# Patient Record
Sex: Female | Born: 1935 | Smoking: Former smoker
Health system: Southern US, Community
[De-identification: ages and names within clinical notes are randomized; demographics above are authoritative.]

## PROBLEM LIST (undated history)

## (undated) DIAGNOSIS — E7800 Pure hypercholesterolemia, unspecified: Secondary | ICD-10-CM

## (undated) DIAGNOSIS — H9190 Unspecified hearing loss, unspecified ear: Secondary | ICD-10-CM

## (undated) DIAGNOSIS — H4900 Third [oculomotor] nerve palsy, unspecified eye: Secondary | ICD-10-CM

## (undated) DIAGNOSIS — I517 Cardiomegaly: Secondary | ICD-10-CM

## (undated) DIAGNOSIS — M170 Bilateral primary osteoarthritis of knee: Secondary | ICD-10-CM

## (undated) DIAGNOSIS — E119 Type 2 diabetes mellitus without complications: Secondary | ICD-10-CM

## (undated) DIAGNOSIS — K8020 Calculus of gallbladder without cholecystitis without obstruction: Secondary | ICD-10-CM

## (undated) DIAGNOSIS — E042 Nontoxic multinodular goiter: Secondary | ICD-10-CM

## (undated) DIAGNOSIS — I2510 Atherosclerotic heart disease of native coronary artery without angina pectoris: Secondary | ICD-10-CM

## (undated) DIAGNOSIS — D219 Benign neoplasm of connective and other soft tissue, unspecified: Secondary | ICD-10-CM

## (undated) DIAGNOSIS — I729 Aneurysm of unspecified site: Secondary | ICD-10-CM

## (undated) DIAGNOSIS — I728 Aneurysm of other specified arteries: Secondary | ICD-10-CM

## (undated) DIAGNOSIS — K2970 Gastritis, unspecified, without bleeding: Secondary | ICD-10-CM

## (undated) DIAGNOSIS — E785 Hyperlipidemia, unspecified: Secondary | ICD-10-CM

## (undated) DIAGNOSIS — R32 Unspecified urinary incontinence: Secondary | ICD-10-CM

## (undated) DIAGNOSIS — L03031 Cellulitis of right toe: Secondary | ICD-10-CM

## (undated) DIAGNOSIS — G4700 Insomnia, unspecified: Secondary | ICD-10-CM

## (undated) DIAGNOSIS — I7090 Unspecified atherosclerosis: Secondary | ICD-10-CM

## (undated) DIAGNOSIS — J42 Unspecified chronic bronchitis: Secondary | ICD-10-CM

## (undated) DIAGNOSIS — M1990 Unspecified osteoarthritis, unspecified site: Secondary | ICD-10-CM

## (undated) HISTORY — DX: Insomnia, unspecified: G47.00

## (undated) HISTORY — DX: Calculus of gallbladder without cholecystitis without obstruction: K80.20

## (undated) HISTORY — DX: Gastritis, unspecified, without bleeding: K29.70

## (undated) HISTORY — DX: Cellulitis of right toe: L03.031

## (undated) HISTORY — DX: Benign neoplasm of connective and other soft tissue, unspecified: D21.9

## (undated) HISTORY — DX: Aneurysm of unspecified site: I72.9

## (undated) HISTORY — DX: Unspecified urinary incontinence: R32

## (undated) HISTORY — DX: Aneurysm of other specified arteries: I72.8

## (undated) HISTORY — DX: Unspecified osteoarthritis, unspecified site: M19.90

## (undated) HISTORY — DX: Nontoxic multinodular goiter: E04.2

## (undated) HISTORY — PX: CHOLECYSTECTOMY: SHX55

## (undated) HISTORY — DX: Atherosclerotic heart disease of native coronary artery without angina pectoris: I25.10

## (undated) HISTORY — DX: Bilateral primary osteoarthritis of knee: M17.0

## (undated) HISTORY — DX: Type 2 diabetes mellitus without complications: E11.9

## (undated) HISTORY — DX: Cardiomegaly: I51.7

## (undated) HISTORY — DX: Hyperlipidemia, unspecified: E78.5

## (undated) HISTORY — DX: Unspecified chronic bronchitis: J42

## (undated) HISTORY — DX: Third (oculomotor) nerve palsy, unspecified eye: H49.00

## (undated) HISTORY — DX: Pure hypercholesterolemia, unspecified: E78.00

## (undated) HISTORY — DX: Unspecified atherosclerosis: I70.90

## (undated) HISTORY — DX: Unspecified hearing loss, unspecified ear: H91.90

---

## 2005-09-12 HISTORY — PX: CRANIOTOMY: SHX93

## 2005-12-07 DIAGNOSIS — J4489 Other specified chronic obstructive pulmonary disease: Secondary | ICD-10-CM | POA: Insufficient documentation

## 2005-12-07 DIAGNOSIS — I671 Cerebral aneurysm, nonruptured: Secondary | ICD-10-CM | POA: Insufficient documentation

## 2005-12-07 DIAGNOSIS — I609 Nontraumatic subarachnoid hemorrhage, unspecified: Secondary | ICD-10-CM | POA: Insufficient documentation

## 2006-11-23 HISTORY — PX: JOINT REPLACEMENT: SHX530

## 2011-06-01 DIAGNOSIS — I2510 Atherosclerotic heart disease of native coronary artery without angina pectoris: Secondary | ICD-10-CM

## 2011-06-01 HISTORY — DX: Atherosclerotic heart disease of native coronary artery without angina pectoris: I25.10

## 2012-05-30 HISTORY — PX: CATARACT EXTRACTION: SUR2

## 2014-10-09 DIAGNOSIS — IMO0002 Reserved for concepts with insufficient information to code with codable children: Secondary | ICD-10-CM | POA: Insufficient documentation

## 2014-10-09 DIAGNOSIS — H02401 Unspecified ptosis of right eyelid: Secondary | ICD-10-CM | POA: Insufficient documentation

## 2014-10-09 DIAGNOSIS — I729 Aneurysm of unspecified site: Secondary | ICD-10-CM | POA: Insufficient documentation

## 2016-06-19 DIAGNOSIS — E042 Nontoxic multinodular goiter: Secondary | ICD-10-CM

## 2016-06-19 HISTORY — DX: Nontoxic multinodular goiter: E04.2

## 2016-07-13 DIAGNOSIS — D219 Benign neoplasm of connective and other soft tissue, unspecified: Secondary | ICD-10-CM

## 2016-07-13 HISTORY — DX: Benign neoplasm of connective and other soft tissue, unspecified: D21.9

## 2016-07-14 DIAGNOSIS — G4700 Insomnia, unspecified: Secondary | ICD-10-CM

## 2016-07-14 HISTORY — DX: Insomnia, unspecified: G47.00

## 2016-07-29 HISTORY — PX: CHOLECYSTECTOMY, LAPAROSCOPIC: SHX56

## 2016-08-16 DIAGNOSIS — K2970 Gastritis, unspecified, without bleeding: Secondary | ICD-10-CM

## 2016-08-16 HISTORY — DX: Gastritis, unspecified, without bleeding: K29.70

## 2016-08-17 DIAGNOSIS — R109 Unspecified abdominal pain: Secondary | ICD-10-CM | POA: Insufficient documentation

## 2017-04-25 ENCOUNTER — Other Ambulatory Visit: Payer: Self-pay

## 2017-04-25 DIAGNOSIS — I728 Aneurysm of other specified arteries: Secondary | ICD-10-CM

## 2017-04-26 ENCOUNTER — Encounter: Payer: Self-pay | Admitting: Surgery

## 2017-05-18 DIAGNOSIS — K219 Gastro-esophageal reflux disease without esophagitis: Secondary | ICD-10-CM | POA: Insufficient documentation

## 2017-05-18 DIAGNOSIS — G8921 Chronic pain due to trauma: Secondary | ICD-10-CM | POA: Insufficient documentation

## 2017-06-26 ENCOUNTER — Ambulatory Visit (INDEPENDENT_AMBULATORY_CARE_PROVIDER_SITE_OTHER): Payer: Medicare Other | Admitting: Surgery

## 2017-06-26 ENCOUNTER — Ambulatory Visit (HOSPITAL_COMMUNITY)
Admission: RE | Admit: 2017-06-26 | Discharge: 2017-06-26 | Disposition: A | Discharge status: Home / Self Care | Payer: Medicare Other | Source: Ambulatory Visit | Attending: Surgery | Admitting: Surgery

## 2017-06-26 ENCOUNTER — Encounter: Payer: Self-pay | Admitting: Surgery

## 2017-06-26 VITALS — BP 177/83 | HR 51 | Temp 97.8°F | Resp 20 | Ht 65.0 in | Wt 176.0 lb

## 2017-06-26 DIAGNOSIS — I728 Aneurysm of other specified arteries: Secondary | ICD-10-CM | POA: Diagnosis present
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
## 2017-06-27 NOTE — Addendum Note (Signed)
Addended by: Lianne Cure A on: 06/27/2017 04:05 PM   Modules accepted: Orders

## 2018-01-08 ENCOUNTER — Ambulatory Visit: Payer: Medicare Other | Admitting: Surgery

## 2018-01-08 ENCOUNTER — Encounter (HOSPITAL_COMMUNITY): Payer: Medicare Other

## 2018-03-08 DIAGNOSIS — N319 Neuromuscular dysfunction of bladder, unspecified: Secondary | ICD-10-CM | POA: Insufficient documentation

## 2018-07-23 ENCOUNTER — Encounter (HOSPITAL_COMMUNITY): Payer: Medicare Other

## 2018-07-23 ENCOUNTER — Ambulatory Visit: Payer: Medicare Other | Admitting: Surgery

## 2018-08-06 ENCOUNTER — Encounter: Payer: Self-pay | Admitting: Surgery

## 2018-08-06 ENCOUNTER — Ambulatory Visit (INDEPENDENT_AMBULATORY_CARE_PROVIDER_SITE_OTHER): Payer: Medicare Other | Admitting: Surgery

## 2018-08-06 ENCOUNTER — Ambulatory Visit (HOSPITAL_COMMUNITY)
Admission: RE | Admit: 2018-08-06 | Discharge: 2018-08-06 | Disposition: A | Discharge status: Home / Self Care | Payer: Medicare Other | Source: Ambulatory Visit | Attending: Surgery | Admitting: Surgery

## 2018-08-06 ENCOUNTER — Other Ambulatory Visit: Payer: Self-pay

## 2018-08-06 VITALS — BP 181/87 | HR 48 | Resp 18 | Ht 65.0 in | Wt 179.5 lb

## 2018-08-06 DIAGNOSIS — I728 Aneurysm of other specified arteries: Secondary | ICD-10-CM | POA: Diagnosis present
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
## 2019-03-01 DIAGNOSIS — I351 Nonrheumatic aortic (valve) insufficiency: Secondary | ICD-10-CM | POA: Insufficient documentation

## 2019-03-01 DIAGNOSIS — E782 Mixed hyperlipidemia: Secondary | ICD-10-CM | POA: Insufficient documentation

## 2019-03-01 DIAGNOSIS — I714 Abdominal aortic aneurysm, without rupture, unspecified: Secondary | ICD-10-CM | POA: Insufficient documentation

## 2019-04-12 ENCOUNTER — Telehealth: Payer: Self-pay | Admitting: *Deleted

## 2019-04-12 NOTE — Telephone Encounter (Signed)
Call from patient 's daughter. Patient is having "increasing gas and stomach pain" is scheduled to see Dr. Trula Slade in around November and wants to be seen sooner. States they have not had appt. With PCP which is scheduled in 3 months. I advised them to contact PCP about this problem and request to be seen sooner and have them call this office for appointment if vascular services needed.

## 2019-06-06 DIAGNOSIS — M79606 Pain in leg, unspecified: Secondary | ICD-10-CM | POA: Insufficient documentation

## 2019-06-06 DIAGNOSIS — G894 Chronic pain syndrome: Secondary | ICD-10-CM | POA: Insufficient documentation

## 2019-06-06 DIAGNOSIS — I639 Cerebral infarction, unspecified: Secondary | ICD-10-CM | POA: Insufficient documentation

## 2019-06-06 DIAGNOSIS — I607 Nontraumatic subarachnoid hemorrhage from unspecified intracranial artery: Secondary | ICD-10-CM | POA: Insufficient documentation

## 2019-09-01 ENCOUNTER — Other Ambulatory Visit: Payer: Self-pay

## 2019-09-01 DIAGNOSIS — I728 Aneurysm of other specified arteries: Secondary | ICD-10-CM

## 2019-09-02 ENCOUNTER — Inpatient Hospital Stay (HOSPITAL_COMMUNITY): Admission: RE | Admit: 2019-09-02 | Payer: Medicare Other | Source: Ambulatory Visit

## 2019-09-02 ENCOUNTER — Ambulatory Visit: Payer: Medicare Other | Admitting: Surgery

## 2019-09-05 ENCOUNTER — Ambulatory Visit (HOSPITAL_COMMUNITY)
Admission: RE | Admit: 2019-09-05 | Discharge: 2019-09-05 | Disposition: A | Discharge status: Home / Self Care | Payer: Medicare (Managed Care) | Source: Ambulatory Visit | Attending: Surgery | Admitting: Surgery

## 2019-09-05 ENCOUNTER — Other Ambulatory Visit: Payer: Self-pay

## 2019-09-05 DIAGNOSIS — I728 Aneurysm of other specified arteries: Secondary | ICD-10-CM | POA: Diagnosis not present

## 2019-09-11 ENCOUNTER — Other Ambulatory Visit: Payer: Self-pay | Admitting: Surgery

## 2019-09-11 ENCOUNTER — Other Ambulatory Visit (HOSPITAL_COMMUNITY): Payer: Self-pay | Admitting: Surgery

## 2019-09-11 DIAGNOSIS — I728 Aneurysm of other specified arteries: Secondary | ICD-10-CM

## 2019-09-16 ENCOUNTER — Ambulatory Visit (INDEPENDENT_AMBULATORY_CARE_PROVIDER_SITE_OTHER): Payer: Self-pay | Admitting: Surgery

## 2019-09-16 ENCOUNTER — Ambulatory Visit (HOSPITAL_COMMUNITY)
Admission: RE | Admit: 2019-09-16 | Discharge: 2019-09-16 | Disposition: A | Discharge status: Home / Self Care | Payer: Medicare (Managed Care) | Source: Ambulatory Visit | Attending: Surgery | Admitting: Surgery

## 2019-09-16 ENCOUNTER — Encounter: Payer: Self-pay | Admitting: Surgery

## 2019-09-16 ENCOUNTER — Encounter: Payer: Self-pay | Admitting: *Deleted

## 2019-09-16 ENCOUNTER — Other Ambulatory Visit: Payer: Self-pay

## 2019-09-16 VITALS — BP 147/70 | Temp 97.6°F | Resp 20 | Ht 65.0 in | Wt 180.0 lb

## 2019-09-16 DIAGNOSIS — I728 Aneurysm of other specified arteries: Secondary | ICD-10-CM
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
## 2019-09-23 ENCOUNTER — Ambulatory Visit: Payer: Medicare (Managed Care) | Admitting: Surgery

## 2019-10-08 ENCOUNTER — Other Ambulatory Visit: Payer: Self-pay

## 2019-10-08 DIAGNOSIS — I728 Aneurysm of other specified arteries: Secondary | ICD-10-CM

## 2019-10-08 DIAGNOSIS — I714 Abdominal aortic aneurysm, without rupture, unspecified: Secondary | ICD-10-CM

## 2019-10-28 ENCOUNTER — Other Ambulatory Visit: Payer: Self-pay

## 2019-10-28 ENCOUNTER — Ambulatory Visit
Admission: RE | Admit: 2019-10-28 | Discharge: 2019-10-28 | Disposition: A | Discharge status: Home / Self Care | Payer: Medicare (Managed Care) | Source: Ambulatory Visit | Attending: Surgery | Admitting: Surgery

## 2019-10-28 DIAGNOSIS — I728 Aneurysm of other specified arteries: Secondary | ICD-10-CM

## 2019-10-28 DIAGNOSIS — I714 Abdominal aortic aneurysm, without rupture, unspecified: Secondary | ICD-10-CM

## 2019-10-28 MED ORDER — IOPAMIDOL (ISOVUE-370) INJECTION 76%
75.0000 mL | Freq: Once | INTRAVENOUS | Status: AC | PRN
  Administered 2019-10-28: 75 mL via INTRAVENOUS

## 2019-11-04 ENCOUNTER — Ambulatory Visit (INDEPENDENT_AMBULATORY_CARE_PROVIDER_SITE_OTHER): Payer: Medicare (Managed Care) | Admitting: Surgery

## 2019-11-04 ENCOUNTER — Other Ambulatory Visit: Payer: Self-pay

## 2019-11-04 ENCOUNTER — Encounter: Payer: Self-pay | Admitting: Surgery

## 2019-11-04 VITALS — BP 142/80 | HR 68 | Temp 97.4°F | Resp 20 | Ht 65.0 in | Wt 178.0 lb

## 2019-11-04 DIAGNOSIS — I728 Aneurysm of other specified arteries: Secondary | ICD-10-CM
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
## 2020-02-27 LAB — LIPID PANEL
Cholesterol: 240 — AB (ref 0–200)
HDL: 46 (ref 35–70)
LDL Cholesterol: 163
Triglycerides: 157 (ref 40–160)

## 2020-02-27 LAB — BASIC METABOLIC PANEL
BUN: 7 (ref 4–21)
Creatinine: 1 (ref <=1.1)

## 2020-02-27 LAB — COMPREHENSIVE METABOLIC PANEL
GFR calc Af Amer: 60
GFR calc non Af Amer: 52

## 2020-03-13 LAB — TSH: TSH: 0.06 — AB (ref 0.41–5.90)

## 2020-05-04 ENCOUNTER — Encounter: Payer: Self-pay | Admitting: "Endocrinology

## 2020-05-04 ENCOUNTER — Other Ambulatory Visit: Payer: Self-pay

## 2020-05-04 ENCOUNTER — Ambulatory Visit (INDEPENDENT_AMBULATORY_CARE_PROVIDER_SITE_OTHER): Payer: Medicare (Managed Care) | Admitting: "Endocrinology

## 2020-05-04 VITALS — BP 124/76 | HR 64 | Ht 65.0 in | Wt 177.0 lb

## 2020-05-04 DIAGNOSIS — E0590 Thyrotoxicosis, unspecified without thyrotoxic crisis or storm: Secondary | ICD-10-CM | POA: Diagnosis not present

## 2020-05-04 NOTE — Progress Notes (Signed)
05/04/2020     Endocrinology Consult Note    Subjective:    Patient ID: Angelica Rodriguez, female    DOB: [birth year], PCP Addison Naegeli, DO.   Past Medical History:  Diagnosis Date  . 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 Aneurysm ([place])    Cerebral  . 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 Atherosclerosis   . Cardiomegaly   . Cellulitis of right toe   . Cholelithiasis   . Chronic bronchitis ([place])   . Diabetes mellitus without complication (HCC)    Non insulin dependent  . DJD (degenerative joint disease)   . Gastritis 08/16/2016  . Hearing decreased, unspecified laterality   . Hyperlipidemia   . Insomnia 07/14/2016  . Leiomyoma 07/13/2016  . Multinodular goiter 06/19/2016  . Primary osteoarthritis of both knees   . Pure hypercholesterolemia   . Urinary incontinence     Past Surgical History:  Procedure Laterality Date  . CATARACT EXTRACTION  05/30/2012   OD  . CHOLECYSTECTOMY    . CHOLECYSTECTOMY, LAPAROSCOPIC  07/29/2016   Dr. Quinn Axe  . CRANIOTOMY  2007   Cerebral aneurysm  . JOINT REPLACEMENT  11/23/2006   Total Knee    Social History   Socioeconomic History  . Marital status: Unknown    Spouse name: Not on file  . Number of children: Not on file  . Years of education: Not on file  . Highest education level: Not on file  Occupational History  . Not on file  Tobacco Use  . Smoking status: Former Smoker    Quit date: 1978    Years since quitting: 43.6  . Smokeless tobacco: Never Used  Vaping Use  . Vaping Use: Never used  Substance and Sexual Activity  . Alcohol use: No  . Drug use: No  . Sexual activity: Not on file  Other Topics Concern  . Not on file  Social History Narrative  . Not on file   Social Determinants of Health   Financial Resource Strain:   . Difficulty of Paying Living Expenses: Not on file  Food Insecurity:   . Worried About Charity fundraiser in the Last Year: Not on file  . Ran Out of  Food in the Last Year: Not on file  Transportation Needs:   . Lack of Transportation (Medical): Not on file  . Lack of Transportation (Non-Medical): Not on file  Physical Activity:   . Days of Exercise per Week: Not on file  . Minutes of Exercise per Session: Not on file  Stress:   . Feeling of Stress : Not on file  Social Connections:   . Frequency of Communication with Friends and Family: Not on file  . Frequency of Social Gatherings with Friends and Family: Not on file  . Attends Religious Services: Not on file  . Active Member of Clubs or Organizations: Not on file  . Attends Archivist Meetings: Not on file  . Marital Status: Not on file    History reviewed. No pertinent family history.  Outpatient Encounter Medications as of 05/04/2020  Medication Sig  . amLODipine (NORVASC) 10 MG tablet Take 10 mg by mouth daily.  . famotidine (PEPCID) 20 MG tablet Take 20 mg by mouth 2 (two) times daily.  Marland Kitchen gabapentin (NEURONTIN) 300 MG capsule Take 300 mg by mouth 3 (three) times daily.  . hydrochlorothiazide (HYDRODIURIL) 25 MG tablet Take 25 mg by mouth daily.  Marland Kitchen HYDROcodone-acetaminophen ([place])  10-325 MG tablet Take 1 tablet by mouth every 6 (six) hours as needed.  . pantoprazole (PROTONIX) 40 MG tablet Take 40 mg by mouth daily.  . rosuvastatin (CRESTOR) 10 MG tablet Take 10 mg by mouth at bedtime.  . sennosides-docusate sodium (SENOKOT-S) 8.6-50 MG tablet Take 1 tablet by mouth daily.  . [DISCONTINUED] albuterol (PROVENTIL HFA;VENTOLIN HFA) 108 (90 Base) MCG/ACT inhaler Inhale into the lungs every 6 (six) hours as needed for wheezing or shortness of breath. (Patient not taking: Reported on 05/04/2020)  . [DISCONTINUED] amitriptyline (ELAVIL) 10 MG tablet Take 10 mg by mouth at bedtime. (Patient not taking: Reported on 05/04/2020)  . [DISCONTINUED] aspirin EC 81 MG tablet Take 81 mg by mouth daily. (Patient not taking: Reported on 05/04/2020)  . [DISCONTINUED] atorvastatin  (LIPITOR) 40 MG tablet   . [DISCONTINUED] buprenorphine (BUTRANS) 10 MCG/HR PTWK patch  (Patient not taking: Reported on 05/04/2020)  . [DISCONTINUED] busPIRone (BUSPAR) 5 MG tablet Take 5 mg by mouth 3 (three) times daily. (Patient not taking: Reported on 05/04/2020)  . [DISCONTINUED] dicloxacillin (DYNAPEN) 250 MG capsule Take 250 mg by mouth 4 (four) times daily. (Patient not taking: Reported on 05/04/2020)  . [DISCONTINUED] esomeprazole (NEXIUM) 40 MG capsule esomeprazole magnesium 40 mg capsule,delayed release  . [DISCONTINUED] gabapentin (NEURONTIN) 100 MG capsule gabapentin 100 mg capsule  1 capsule three times a day  . [DISCONTINUED] levocetirizine (XYZAL) 5 MG tablet Take 5 mg by mouth every evening.  . [DISCONTINUED] meloxicam (MOBIC) 7.5 MG tablet Take 7.5 mg by mouth daily.  . [DISCONTINUED] metoCLOPramide (REGLAN) 10 MG tablet Take 10 mg by mouth 2 (two) times daily.  . [DISCONTINUED] Multiple Vitamin (MULTIVITAMIN) tablet Take 1 tablet by mouth daily.  . [DISCONTINUED] predniSONE (DELTASONE) 20 MG tablet prednisone 20 mg tablet  1 tablet; 20 MG; Once a day  . [DISCONTINUED] ramelteon (ROZEREM) 8 MG tablet   . [DISCONTINUED] simvastatin (ZOCOR) 20 MG tablet Take by mouth.  . [DISCONTINUED] sucralfate (CARAFATE) 1 g tablet Take 2 g by mouth 4 (four) times daily -  with meals and at bedtime.  . [DISCONTINUED] traMADol (ULTRAM) 50 MG tablet tramadol 50 mg tablet  1 tablet as needed; 50 MG; every 4 hrs   No facility-administered encounter medications on file as of 05/04/2020.    ALLERGIES: Allergies  Allergen Reactions  . Losartan     VACCINATION STATUS: Immunization History  Administered Date(s) Administered  . Influenza Split 05/25/2011  . Influenza, High Dose Seasonal PF 07/01/2019     HPI  Angelica Rodriguez is 85 y.o. female who presents today with a medical history as above. she is being seen in consultation for hyperthyroidism requested by Addison Naegeli, DO.  She  is a not a good historian.  She is accompanied by her daughter who also does not know the details of her medical history.  She was found to have suppressed TSH on at least 2 occasions between 2020 in 2021.  Reportedly she lost about 40 pounds in 1 year, as it is advanced, on and off palpitations.  She also feels anxious.  She is not currently on any antithyroid medications nor thyroid hormone supplement.  she denies dysphagia, choking, shortness of breath, no recent voice change.    she does not recall if she has any family history of thyroid dysfunction. she  is willing to proceed with appropriate work up and therapy for thyrotoxicosis.  Review of systems  Constitutional: + weight loss, + fatigue, + subjective hyperthermia Eyes: no blurry vision, - xerophthalmia ENT: no sore throat, no nodules palpated in throat, no dysphagia/odynophagia, nor hoarseness Cardiovascular: no Chest Pain, no Shortness of Breath, -  palpitations, no leg swelling Respiratory: no cough, no SOB Gastrointestinal: no Nausea, no Vomiting, no Diarhhea Musculoskeletal: no muscle/joint aches Skin: no rashes Neurological: +  tremors, no numbness, no tingling, no dizziness Psychiatric: no depression, ++  anxiety   Objective:    BP 124/76   Pulse 64   Ht 5\' 5"  (1.651 m)   Wt 177 lb (80.3 kg)   BMI 29.45 kg/m   Wt Readings from Last 3 Encounters:  05/04/20 177 lb (80.3 kg)  11/04/19 178 lb (80.7 kg)  09/16/19 180 lb (81.6 kg)                                                Physical exam  Constitutional: Body mass index is 29.45 kg/m., not in acute distress, +stable state of mind Eyes: PERRLA, EOMI, - exophthalmos ENT: moist mucous membranes, +  thyromegaly, no cervical lymphadenopathy Cardiovascular: + normal  precordial activity, -tachycardic,  no Murmur/Rubs/Gallops Respiratory:  adequate breathing efforts, no gross chest deformity, Clear to auscultation  bilaterally Gastrointestinal: abdomen soft, Non -tender, No distension, Bowel Sounds present Musculoskeletal: no gross deformities, strength intact in all four extremities Skin: moist, warm, no rashes Neurological: -  tremor with outstretched hands,  -= Deep Tendon Reflexes  on both lower extremities.   CMP     Component Value Date/Time   BUN 7 02/27/2020 0000   CREATININE 1.0 02/27/2020 0000   GFRNONAA 52 02/27/2020 0000   GFRAA 60 02/27/2020 0000     CBC No results found for: WBC, RBC, HGB, HCT, PLT, MCV, MCH, MCHC, RDW, LYMPHSABS, MONOABS, EOSABS, BASOSABS   Diabetic Labs (most recent): No results found for: HGBA1C  Lipid Panel     Component Value Date/Time   CHOL 240 (A) 02/27/2020 0000   TRIG 157 02/27/2020 0000   HDL 46 02/27/2020 0000   LDLCALC 163 02/27/2020 0000     Lab Results  Component Value Date   TSH 0.06 (A) 03/13/2020        Assessment & Plan:   1. Hyperthyroidism  she is being seen at a kind request of Addison Naegeli, DO. her history and most recent labs are reviewed, and she was examined clinically. 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 The potential risks of untreated thyrotoxicosis and the need for definitive therapy have been discussed in detail with her, and she agrees to proceed with diagnostic workup and treatment plan.   She will need confirmatory thyroid uptake and scan which will be scheduled to be done as soon as possible in [facility].  Options of therapy are discussed with her. She will be considered for ablative therapy if she is confirmed to have primary hyperthyroidism.  -Patient is made aware of the high likelihood of post ablative hypothyroidism with subsequent need for lifelong thyroid hormone replacement. sheunderstands this outcome  and she is  willing to proceed.   she will return in 10 days for treatment decision. Her pulse  rate is 64, did not initiate any new prescriptions for her today.  -Patient is advised to maintain close follow  up with Addison Naegeli, DO for primary care needs.   - Time spent with the patient: 45 minutes, of which >50% was spent in obtaining information about her symptoms, reviewing her previous labs, evaluations, and treatments, counseling her about her subclinical hypothyroidism, and developing a plan to confirm the diagnosis and long term treatment as necessary. Please refer to " Patient Self Inventory" in the Media  tab for reviewed elements of pertinent patient history.  Sharilyn Sites participated in the discussions, expressed understanding, and voiced agreement with the above plans.  All questions were answered to her satisfaction. she is encouraged to contact clinic should she have any questions or concerns prior to her return visit.   Follow up plan: Return in about 10 days (around 05/14/2020) for F/U with Thyroid Uptake and Scan.   Thank you for involving me in the care of this pleasant patient, and I will continue to update you with her progress.  Glade Lloyd, MD [facility] Endocrin[facility] [place] [facility] Phone: [phone]  Fax: [phone]   05/04/2020, 3:37 PM  This note was partially dictated with voice recognition software. Similar sounding words can be transcribed inadequately or may not  be corrected upon review.

## 2020-05-14 ENCOUNTER — Ambulatory Visit: Payer: Medicare (Managed Care) | Admitting: "Endocrinology

## 2020-05-21 ENCOUNTER — Encounter: Payer: Self-pay | Admitting: "Endocrinology

## 2020-05-21 ENCOUNTER — Telehealth (INDEPENDENT_AMBULATORY_CARE_PROVIDER_SITE_OTHER): Payer: Medicare (Managed Care) | Admitting: "Endocrinology

## 2020-05-21 DIAGNOSIS — E0590 Thyrotoxicosis, unspecified without thyrotoxic crisis or storm: Secondary | ICD-10-CM | POA: Insufficient documentation

## 2020-05-21 DIAGNOSIS — E079 Disorder of thyroid, unspecified: Secondary | ICD-10-CM

## 2020-05-21 NOTE — Progress Notes (Signed)
05/21/2020                                    Endocrinology Telehealth Visit Follow up Note -During COVID -19 Pandemic  This visit type was conducted  via phone due to national recommendations for restrictions regarding the COVID-19 Pandemic  in an effort to limit this patient's exposure and mitigate transmission of the corona virus.   I connected with Angelica Rodriguez on 05/21/2020   by telephone and verified that I am speaking with the correct person using two identifiers. Angelica Rodriguez, 1934/12/31. she has verbally consented to this visit.  I was in my office and patient was in her residence. No other persons were with me during the encounter.  She was assisted by her daughter during this visit. All issues noted in this document were discussed and addressed. The format was not optimal for physical exam.  Subjective:    Patient ID: Angelica Rodriguez, female    DOB: 1935/05/29, PCP Addison Naegeli, DO.   Past Medical History:  Diagnosis Date  . 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 Atherosclerosis   . Cardiomegaly   . Cellulitis of right toe   . Cholelithiasis   . Chronic bronchitis ([place])   . Diabetes mellitus without complication (HCC)    Non insulin dependent  . DJD (degenerative joint disease)   . Gastritis 08/16/2016  . Hearing decreased, unspecified laterality   . Hyperlipidemia   . Insomnia 07/14/2016  . Leiomyoma 07/13/2016  . Multinodular goiter 06/19/2016  . Primary osteoarthritis of both knees   . Pure hypercholesterolemia   . Urinary incontinence     Past Surgical History:  Procedure Laterality Date  . CATARACT EXTRACTION  05/30/2012   OD  . CHOLECYSTECTOMY    . CHOLECYSTECTOMY, LAPAROSCOPIC  07/29/2016   Dr. Quinn Axe  . CRANIOTOMY  2007   Cerebral aneurysm  . JOINT REPLACEMENT  11/23/2006   Total Knee    Social History   Socioeconomic History  . Marital  status: Unknown    Spouse name: Not on file  . Number of children: Not on file  . Years of education: Not on file  . Highest education level: Not on file  Occupational History  . Not on file  Tobacco Use  . Smoking status: Former Smoker    Quit date: 1978    Years since quitting: 43.7  . Smokeless tobacco: Never Used  Vaping Use  . Vaping Use: Never used  Substance and Sexual Activity  . Alcohol use: No  . Drug use: No  . Sexual activity: Not on file  Other Topics Concern  . Not on file  Social History Narrative  . Not on file   Social Determinants of Health   Financial Resource Strain:   . Difficulty of Paying Living Expenses: Not on file  Food Insecurity:   . Worried About Charity fundraiser in the Last Year: Not on file  . Ran Out of Food in the Last Year: Not on file  Transportation Needs:   . Lack of Transportation (Medical): Not on file  . Lack of Transportation (Non-Medical): Not on file  Physical Activity:   . Days of Exercise per Week: Not on file  . Minutes of Exercise per Session: Not on file  Stress:   .  Feeling of Stress : Not on file  Social Connections:   . Frequency of Communication with Friends and Family: Not on file  . Frequency of Social Gatherings with Friends and Family: Not on file  . Attends Religious Services: Not on file  . Active Member of Clubs or Organizations: Not on file  . Attends Archivist Meetings: Not on file  . Marital Status: Not on file    History reviewed. No pertinent family history.  Outpatient Encounter Medications as of 05/21/2020  Medication Sig  . amLODipine (NORVASC) 10 MG tablet Take 10 mg by mouth daily.  . famotidine (PEPCID) 20 MG tablet Take 20 mg by mouth 2 (two) times daily.  Marland Kitchen gabapentin (NEURONTIN) 300 MG capsule Take 300 mg by mouth 3 (three) times daily.  . hydrochlorothiazide (HYDRODIURIL) 25 MG tablet Take 25 mg by mouth daily.  Marland Kitchen HYDROcodone-acetaminophen (NORCO) 10-325 MG tablet Take 1 tablet  by mouth every 6 (six) hours as needed.  . pantoprazole (PROTONIX) 40 MG tablet Take 40 mg by mouth daily.  . rosuvastatin (CRESTOR) 10 MG tablet Take 10 mg by mouth at bedtime.  . sennosides-docusate sodium (SENOKOT-S) 8.6-50 MG tablet Take 1 tablet by mouth daily.   No facility-administered encounter medications on file as of 05/21/2020.    ALLERGIES: Allergies  Allergen Reactions  . Losartan     VACCINATION STATUS: Immunization History  Administered Date(s) Administered  . Influenza Split 05/25/2011  . Influenza, High Dose Seasonal PF 07/01/2019     HPI  Angelica Rodriguez is 85 y.o. female who is engaged in telehealth to follow-up after she was seen in consultation for subclinical hyperthyroidism.    She was sent for thyroid uptake and scan during her last visit which she has completed in [place].  24-hour uptake was 19.3%, normal range 15-35%.   She has no new complaints today.  She is not currently on any antithyroid medications nor thyroid hormone supplement.  she denies dysphagia, choking, shortness of breath, no recent voice change.    she does not recall if she has any family history of thyroid dysfunction. she  is willing to proceed with appropriate work up and therapy for thyrotoxicosis.                           Review of systems Limited as above.   Objective:    There were no vitals taken for this visit.  Wt Readings from Last 3 Encounters:  05/04/20 177 lb (80.3 kg)  11/04/19 178 lb (80.7 kg)  09/16/19 180 lb (81.6 kg)           CMP     Component Value Date/Time   BUN 7 02/27/2020 0000   CREATININE 1.0 02/27/2020 0000   GFRNONAA 52 02/27/2020 0000   GFRAA 60 02/27/2020 0000     CBC No results found for: WBC, RBC, HGB, HCT, PLT, MCV, MCH, MCHC, RDW, LYMPHSABS, MONOABS, EOSABS, BASOSABS   Diabetic Labs (most recent): No results found for: HGBA1C  Lipid Panel     Component Value Date/Time   CHOL 240 (A) 02/27/2020 0000   TRIG 157  02/27/2020 0000   HDL 46 02/27/2020 0000   LDLCALC 163 02/27/2020 0000     Lab Results  Component Value Date   TSH 0.06 (A) 03/13/2020    Her accompanying free T4 was 0.89, free T3 was 2.7  Uptake and scan findings: The gland appears enlarged.  Asymmetric nodular  increased uptake demonstrated within the right lobe and lingula.  No evidence of thyroid nodule.  24-hour uptake is 19.3%.  Normal range 15 to 35%.  Assessment & Plan:   1.  Subclinical hyperthyroidism  -Her work-up so far is not consistent with difficult primary hyperthyroidism.  Her uptake is only 19.3%.  Her uptake indicates possible hyperfunctioning nodules.  She will need repeat thyroid function test in 3 months.  If she continues to have abnormally suppressed TSH and higher thyroid hormone levels, she will be considered for low-dose methimazole.   She will not need ablative treatment at this time.  Her reported weight loss is likely secondary to inadequate oral intake. Her pulse rate is 64, did not initiate any new prescriptions for her today.  -Patient is advised to maintain close follow up with Addison Naegeli, DO for primary care needs.      - Time spent on this patient care encounter:  20 minutes of which 50% was spent in  counseling and the rest reviewing  her current and  previous labs / studies and medications  doses and developing a plan for long term care. Angelica Rodriguez  participated in the discussions, expressed understanding, and voiced agreement with the above plans.  All questions were answered to her satisfaction. she is encouraged to contact clinic should she have any questions or concerns prior to her return visit.  Follow up plan: Return in about 3 months (around 08/20/2020) for F/U with Pre-visit Labs.   Thank you for involving me in the care of this pleasant patient, and I will continue to update you with her progress.  Glade Lloyd, MD [facility] [place]  [facility] Phone: [phone]  Fax: [phone]   05/21/2020, 5:44 PM  This note was partially dictated with voice recognition software. Similar sounding words can be transcribed inadequately or may not  be corrected upon review.

## 2020-08-20 ENCOUNTER — Ambulatory Visit: Payer: Medicare (Managed Care) | Admitting: "Endocrinology

## 2020-10-09 ENCOUNTER — Other Ambulatory Visit: Payer: Self-pay

## 2020-10-09 DIAGNOSIS — I728 Aneurysm of other specified arteries: Secondary | ICD-10-CM

## 2020-11-05 ENCOUNTER — Inpatient Hospital Stay: Admission: RE | Admit: 2020-11-05 | Payer: Medicare (Managed Care) | Source: Ambulatory Visit

## 2020-11-05 ENCOUNTER — Other Ambulatory Visit: Payer: Medicare (Managed Care)

## 2020-11-09 ENCOUNTER — Ambulatory Visit: Payer: Medicare (Managed Care) | Admitting: Surgery

## 2020-11-09 ENCOUNTER — Other Ambulatory Visit: Payer: Self-pay

## 2020-11-13 ENCOUNTER — Other Ambulatory Visit: Payer: Self-pay

## 2020-11-13 DIAGNOSIS — I714 Abdominal aortic aneurysm, without rupture, unspecified: Secondary | ICD-10-CM

## 2020-11-13 DIAGNOSIS — I728 Aneurysm of other specified arteries: Secondary | ICD-10-CM

## 2020-12-14 ENCOUNTER — Ambulatory Visit: Payer: Medicare (Managed Care) | Admitting: Surgery

## 2021-02-12 ENCOUNTER — Other Ambulatory Visit: Payer: Self-pay | Admitting: Surgery

## 2021-02-12 IMAGING — CT CT CTA ABD/PEL W/CM AND/OR W/O CM
1 of 4 series · 12 of 32 positions shown, 17 images · IV contrast (APPLIED)
Comparison: None.

CLINICAL DATA: Follow-up abdominal aortic aneurysm and celiac
artery aneurysm.

EXAM:
CT ANGIOGRAPHY ABDOMEN AND PELVIS WITH CONTRAST AND WITHOUT CONTRAST
TECHNIQUE: Multidetector CT imaging of the abdomen and pelvis was performed
using the standard protocol during bolus administration of
intravenous contrast. Multiplanar reconstructed images and MIPs were
obtained and reviewed to evaluate the vascular anatomy.
CONTRAST:  75mL [id number] IOPAMIDOL ([id number]) INJECTION 76%

[Series 6: pre stent angio · axial · non-contrast · 0.76mm/px · z∈[-288,+64]mm · 12 of 397 slices shown, 17 images]
[im 23/397  soft-tissue]
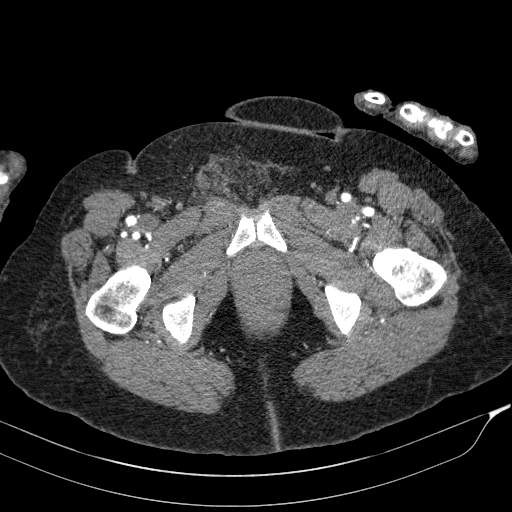
[im 23/397  bone]
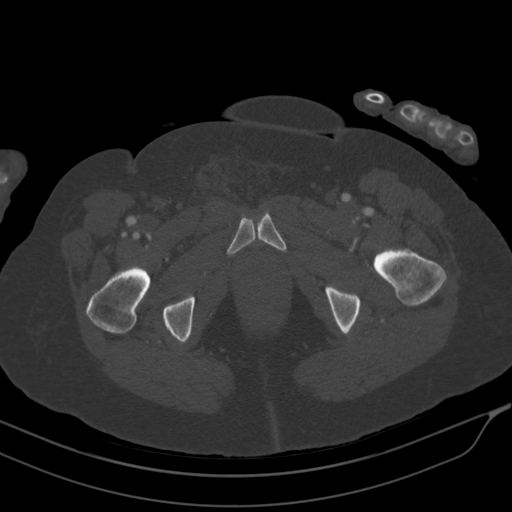
[im 67/397  soft-tissue]
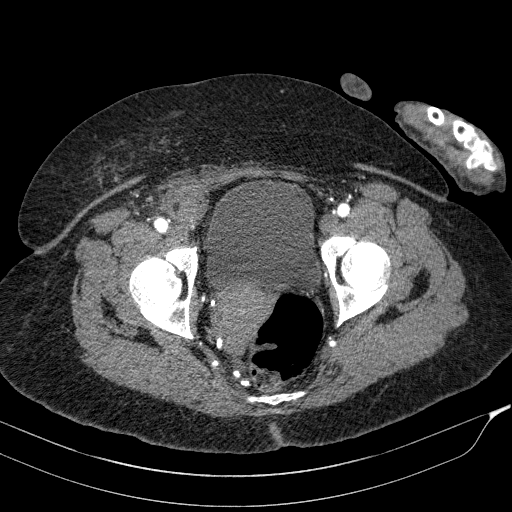
[im 89/397  soft-tissue]
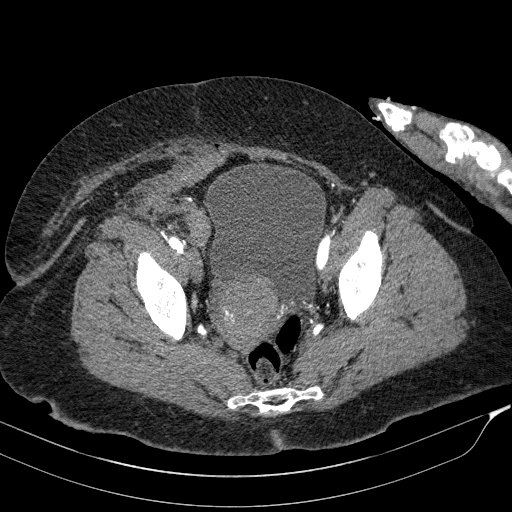
[im 133/397  soft-tissue]
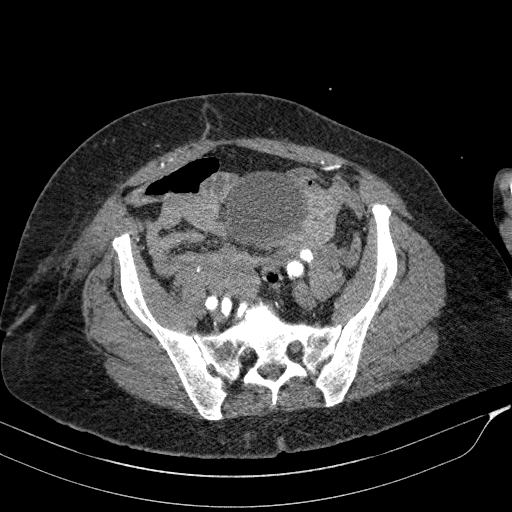
[im 155/397  soft-tissue]
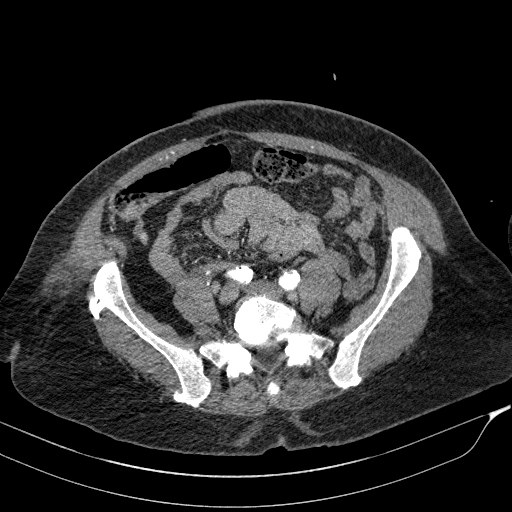
[im 199/397  soft-tissue]
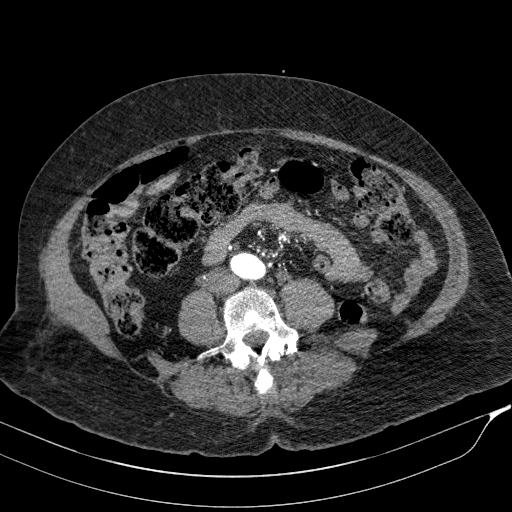
[im 243/397  soft-tissue]
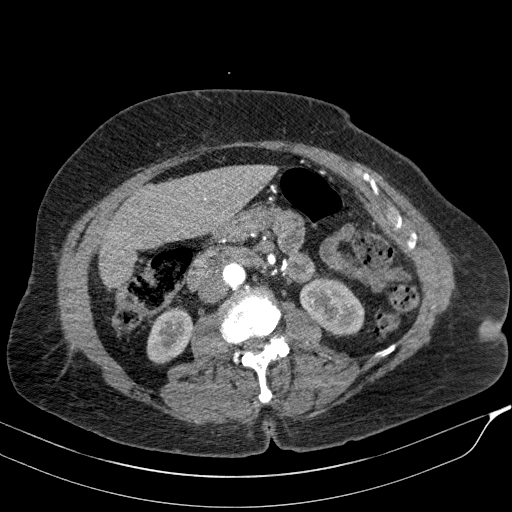
[im 265/397  soft-tissue]
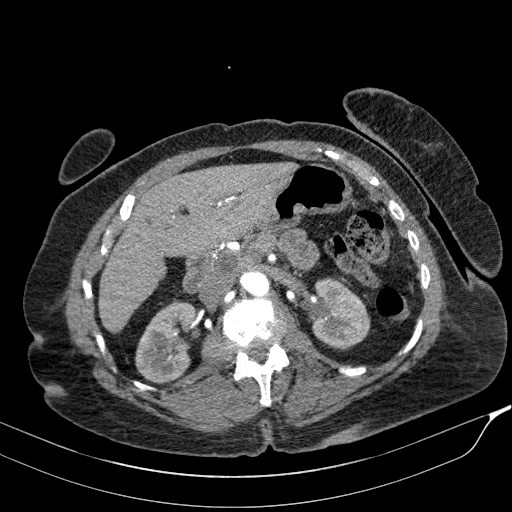
[im 309/397  soft-tissue]
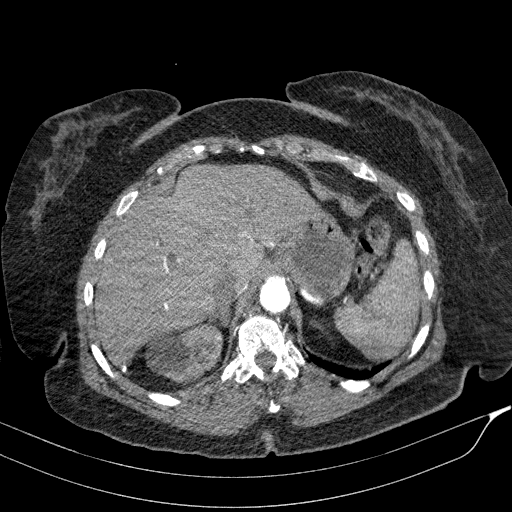
[im 309/397  lung]
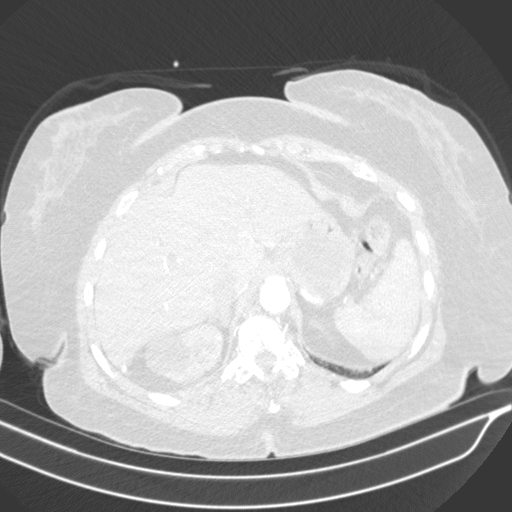
[im 309/397  bone]
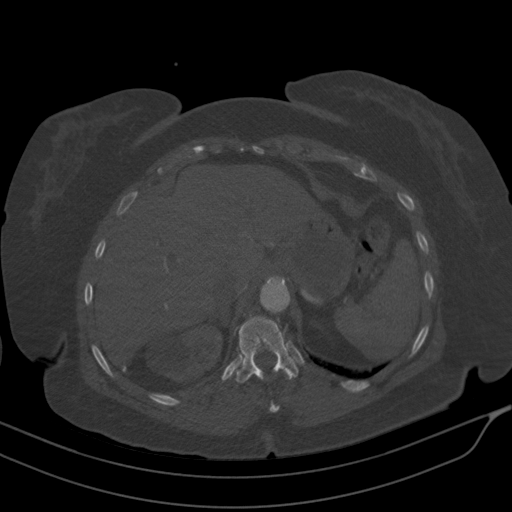
[im 331/397  soft-tissue]
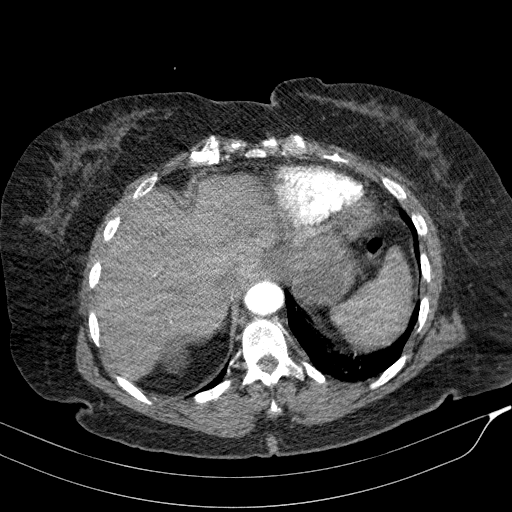
[im 331/397  lung]
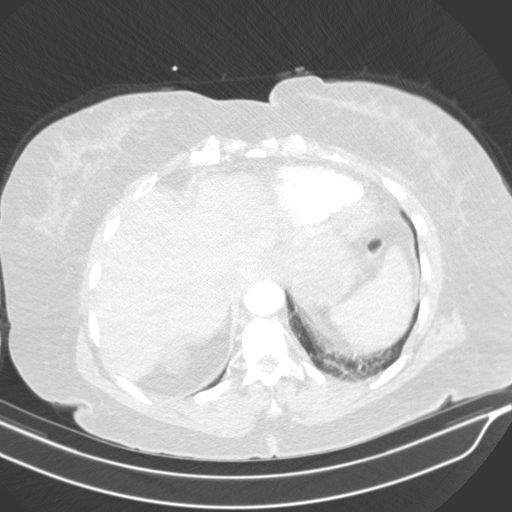
[im 353/397  lung]
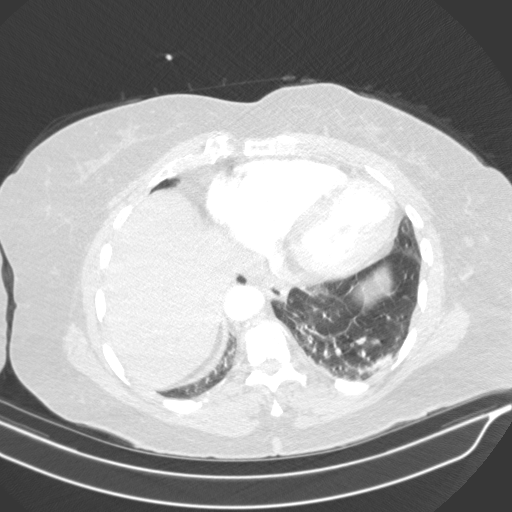
[im 375/397  soft-tissue]
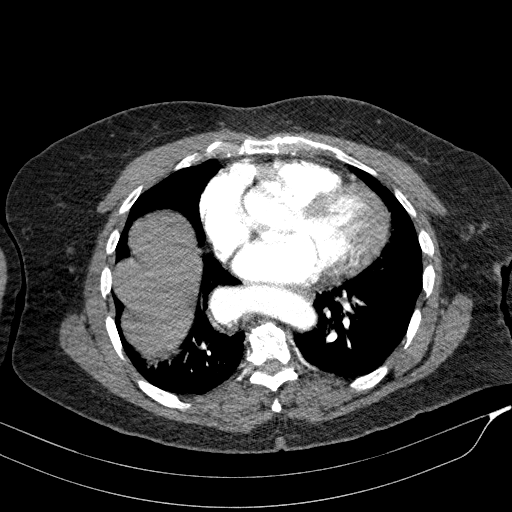
[im 375/397  lung]
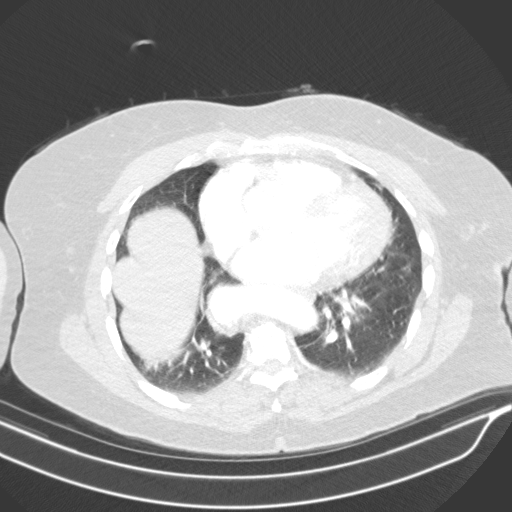

[12 of 32 positions shown; findings below may reference images not displayed]

FINDINGS: VASCULAR

Aorta: Distal descending thoracic aorta is very torturous. Mild
tortuosity of the abdominal aorta without aneurysm. Atherosclerotic
calcifications in the abdominal aorta without significant stenosis.

Celiac: Origin of the celiac trunk is patent. The proximal celiac
trunk has a slightly irregular contour and may be slightly beaded.
Approximately 1.4 cm from the origin, there appears to be a
dissection with an aneurysm. Both lumens appear to be patent. This
dissection/aneurysm measures up to 1.7 cm in diameter. The length of
this dissection/aneurysm measures roughly 1.9 cm. Left gastric
artery appears to be originating from the false lumen or aneurysmal
lumen. Splenic artery and common hepatic artery are patent. Left
gastric artery appears to be providing some supply to the left
hepatic artery.

SMA: Small amount of atherosclerotic plaque at the origin of the SMA
without significant stenosis.

Renals: Both renal arteries are patent without evidence of aneurysm,
dissection, vasculitis, fibromuscular dysplasia or significant
stenosis.

IMA: IMA is patent.

Inflow: Iliac arteries are tortuous with atherosclerotic
calcifications. No significant stenosis in the iliac arteries.
Common, internal and external iliac arteries are patent.

Proximal Outflow: There is focal mild-to-moderate stenosis involving
the proximal right SFA. Proximal femoral arteries are patent
bilaterally.

Veins: No obvious venous abnormality within the limitations of this
arterial phase study.

Review of the MIP images confirms the above findings.

NON-VASCULAR

Lower chest: Scarring or atelectasis in the posterior left lower
lobe. No significant pleural fluid.

Hepatobiliary: Cholecystectomy with dilatation of the common bile
duct measuring up to 1.6 cm. Mild intrahepatic biliary dilatation.

Pancreas: Unremarkable. No pancreatic ductal dilatation or
surrounding inflammatory changes.

Spleen: Normal in size without focal abnormality.

Adrenals/Urinary Tract: Mild fullness of the adrenal glands. At
least 3 prominent right renal cysts, largest measuring roughly
cm. Probable small cyst in the left kidney upper pole. No suspicious
renal lesion. Fluid in the urinary bladder.

Stomach/Bowel: Stomach is within normal limits. Appendix appears
normal. No evidence of bowel wall thickening, distention, or
inflammatory changes.

Lymphatic: No abdominopelvic lymphadenopathy.

Reproductive: Uterus is prominent for age and cannot exclude uterine
fibroids. No evidence for an ovarian or adnexal mass.

Other: Subcutaneous edema in the right inguinal region and right
lower abdominal wall. Reportedly, the patient had recent hernia
surgery. Small amount of subcutaneous gas at the surgical site.
Negative for free air. Negative for free fluid.

Musculoskeletal: No suspicious bone findings.
IMPRESSION: VASCULAR

1. Aneurysm/dissection involving the celiac trunk. Diameter of this
aneurysm measures up to 1.7 cm.
2.  Aortic Atherosclerosis ([id number]-[id number].L).
3. Negative for an abdominal aortic aneurysm.

NON-VASCULAR

1. Edema and postsurgical changes in the right inguinal region
compatible with recent hernia surgery.
2. Fullness of the uterus which is poorly characterized. Findings
could be associated with fibroids.
3. Cholecystectomy. Dilatation of the extrahepatic biliary system
and secondary to the cholecystectomy.
4. Renal cysts.

## 2021-03-01 ENCOUNTER — Other Ambulatory Visit: Payer: Medicare (Managed Care)

## 2021-10-13 ENCOUNTER — Other Ambulatory Visit: Payer: Self-pay

## 2021-10-13 DIAGNOSIS — I728 Aneurysm of other specified arteries: Secondary | ICD-10-CM

## 2021-10-15 ENCOUNTER — Other Ambulatory Visit: Payer: Self-pay

## 2021-10-15 DIAGNOSIS — I728 Aneurysm of other specified arteries: Secondary | ICD-10-CM

## 2021-10-21 ENCOUNTER — Other Ambulatory Visit: Payer: Medicare (Managed Care)

## 2021-10-29 ENCOUNTER — Ambulatory Visit (HOSPITAL_COMMUNITY)
Admission: RE | Admit: 2021-10-29 | Discharge: 2021-10-29 | Disposition: A | Discharge status: Home / Self Care | Payer: Medicare (Managed Care) | Source: Ambulatory Visit | Attending: Surgery | Admitting: Surgery

## 2021-10-29 ENCOUNTER — Other Ambulatory Visit: Payer: Self-pay

## 2021-10-29 DIAGNOSIS — I728 Aneurysm of other specified arteries: Secondary | ICD-10-CM | POA: Diagnosis present

## 2021-10-29 LAB — POCT I-STAT CREATININE: Creatinine, Ser: 1.2 mg/dL — ABNORMAL HIGH (ref 0.44–1.00)

## 2021-10-29 MED ORDER — IOHEXOL 350 MG/ML SOLN
100.0000 mL | Freq: Once | INTRAVENOUS | Status: AC | PRN
  Administered 2021-10-29: 100 mL via INTRAVENOUS

## 2021-11-08 ENCOUNTER — Other Ambulatory Visit: Payer: Self-pay

## 2021-11-08 ENCOUNTER — Encounter: Payer: Self-pay | Admitting: Surgery

## 2021-11-08 ENCOUNTER — Ambulatory Visit (INDEPENDENT_AMBULATORY_CARE_PROVIDER_SITE_OTHER): Payer: Self-pay | Admitting: Surgery

## 2021-11-08 VITALS — BP 134/84 | HR 85 | Temp 97.9°F | Resp 20 | Ht 65.0 in | Wt 197.0 lb

## 2021-11-08 DIAGNOSIS — I728 Aneurysm of other specified arteries: Secondary | ICD-10-CM
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
## 2022-12-13 ENCOUNTER — Telehealth: Payer: Self-pay

## 2022-12-13 ENCOUNTER — Other Ambulatory Visit: Payer: Self-pay

## 2022-12-13 NOTE — Telephone Encounter (Signed)
Pts daughter called wanting to know the status of pts' CTA auth from [facility].  I advised her that I sent in 3 requests and finally spoke to [place] who said pts plan is Out of Network with our Providers and Facility.  Daughter verbalized understanding and will call [facility] and call us back after she gets it straightened out.

## 2022-12-19 ENCOUNTER — Ambulatory Visit: Payer: Medicare (Managed Care) | Admitting: Surgery
